# Patient Record
Sex: Female | Born: 1995 | Race: Black or African American | Hispanic: No | Marital: Single | Smoking: Current every day smoker
Health system: Southern US, Community
[De-identification: ages and names within clinical notes are randomized; demographics above are authoritative.]

## PROBLEM LIST (undated history)

## (undated) DIAGNOSIS — J45909 Unspecified asthma, uncomplicated: Secondary | ICD-10-CM

---

## 2013-12-11 ENCOUNTER — Emergency Department (HOSPITAL_COMMUNITY): Admission: EM | Admit: 2013-12-11 | Discharge: 2013-12-11 | Discharge status: Against Medical Advice | Payer: Self-pay

## 2014-06-14 ENCOUNTER — Encounter (HOSPITAL_COMMUNITY): Payer: Self-pay | Admitting: Emergency Medicine

## 2014-06-14 ENCOUNTER — Emergency Department (HOSPITAL_COMMUNITY)
Admission: EM | Admit: 2014-06-14 | Discharge: 2014-06-14 | Disposition: A | Discharge status: Home / Self Care | Payer: Self-pay | Attending: Emergency Medicine | Admitting: Emergency Medicine

## 2014-06-14 DIAGNOSIS — S01512A Laceration without foreign body of oral cavity, initial encounter: Secondary | ICD-10-CM

## 2014-06-14 DIAGNOSIS — J45909 Unspecified asthma, uncomplicated: Secondary | ICD-10-CM | POA: Insufficient documentation

## 2014-06-14 DIAGNOSIS — Y92410 Unspecified street and highway as the place of occurrence of the external cause: Secondary | ICD-10-CM | POA: Insufficient documentation

## 2014-06-14 DIAGNOSIS — Y9389 Activity, other specified: Secondary | ICD-10-CM | POA: Insufficient documentation

## 2014-06-14 DIAGNOSIS — W503XXA Accidental bite by another person, initial encounter: Secondary | ICD-10-CM | POA: Insufficient documentation

## 2014-06-14 DIAGNOSIS — S01501A Unspecified open wound of lip, initial encounter: Secondary | ICD-10-CM | POA: Insufficient documentation

## 2014-06-14 DIAGNOSIS — S01511A Laceration without foreign body of lip, initial encounter: Secondary | ICD-10-CM

## 2014-06-14 DIAGNOSIS — S01502A Unspecified open wound of oral cavity, initial encounter: Secondary | ICD-10-CM | POA: Insufficient documentation

## 2014-06-14 DIAGNOSIS — S301XXA Contusion of abdominal wall, initial encounter: Secondary | ICD-10-CM | POA: Insufficient documentation

## 2014-06-14 HISTORY — DX: Unspecified asthma, uncomplicated: J45.909
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
## 2014-06-15 NOTE — ED Provider Notes (Signed)
Medical screening examination/treatment/procedure(s) were performed by non-physician practitioner and as supervising physician I was immediately available for consultation/collaboration.    Aurielle Slingerland, MD 06/15/14 0010 

## 2016-03-21 ENCOUNTER — Encounter (HOSPITAL_COMMUNITY): Payer: Self-pay | Admitting: Emergency Medicine

## 2016-03-21 DIAGNOSIS — J45909 Unspecified asthma, uncomplicated: Secondary | ICD-10-CM | POA: Insufficient documentation

## 2016-03-21 DIAGNOSIS — F17200 Nicotine dependence, unspecified, uncomplicated: Secondary | ICD-10-CM | POA: Insufficient documentation

## 2016-03-21 DIAGNOSIS — N938 Other specified abnormal uterine and vaginal bleeding: Secondary | ICD-10-CM | POA: Insufficient documentation

## 2016-03-21 DIAGNOSIS — Z3202 Encounter for pregnancy test, result negative: Secondary | ICD-10-CM | POA: Insufficient documentation

## 2016-03-21 LAB — CBC
HCT: 40 % (ref 36.0–46.0)
HEMOGLOBIN: 13.2 g/dL (ref 12.0–15.0)
MCH: 29.2 pg (ref 26.0–34.0)
MCHC: 33 g/dL (ref 30.0–36.0)
MCV: 88.5 fL (ref 78.0–100.0)
PLATELETS: 266 10*3/uL (ref 150–400)
RBC: 4.52 MIL/uL (ref 3.87–5.11)
RDW: 13.3 % (ref 11.5–15.5)
WBC: 7.9 10*3/uL (ref 4.0–10.5)

## 2016-03-21 LAB — COMPREHENSIVE METABOLIC PANEL
ALT: 15 U/L (ref 14–54)
ANION GAP: 11 (ref 5–15)
AST: 19 U/L (ref 15–41)
Albumin: 3.6 g/dL (ref 3.5–5.0)
Alkaline Phosphatase: 98 U/L (ref 38–126)
BUN: 8 mg/dL (ref 6–20)
CALCIUM: 9.1 mg/dL (ref 8.9–10.3)
CO2: 22 mmol/L (ref 22–32)
Chloride: 107 mmol/L (ref 101–111)
Creatinine, Ser: 0.75 mg/dL (ref 0.44–1.00)
GFR calc Af Amer: >60 mL/min (ref >60)
GFR calc non Af Amer: >60 mL/min (ref >60)
GLUCOSE: 88 mg/dL (ref 65–99)
Potassium: 3.8 mmol/L (ref 3.5–5.1)
SODIUM: 140 mmol/L (ref 135–145)
TOTAL PROTEIN: 7.6 g/dL (ref 6.5–8.1)
Total Bilirubin: 1.2 mg/dL (ref 0.3–1.2)

## 2016-03-21 LAB — URINE MICROSCOPIC-ADD ON

## 2016-03-21 LAB — URINALYSIS, ROUTINE W REFLEX MICROSCOPIC
Glucose, UA: NEGATIVE mg/dL
Ketones, ur: 40 mg/dL — AB
NITRITE: NEGATIVE
Protein, ur: 30 mg/dL — AB
SPECIFIC GRAVITY, URINE: 1.029 (ref 1.005–1.030)
pH: 5.5 (ref 5.0–8.0)

## 2016-03-21 LAB — POC URINE PREG, ED: PREG TEST UR: NEGATIVE

## 2016-03-21 LAB — SAMPLE TO BLOOD BANK

## 2016-03-21 LAB — LIPASE, BLOOD: Lipase: 32 U/L (ref 11–51)

## 2016-03-21 NOTE — ED Notes (Signed)
Pt called several times to bring back to triage and unable to find her.

## 2016-03-21 NOTE — ED Notes (Signed)
Pt. reports vaginal bleeding for 4 weeks with generalized abdominal pain , denies  emesis or diarrhea , no fever or chills.

## 2016-03-22 ENCOUNTER — Emergency Department (HOSPITAL_COMMUNITY)
Admission: EM | Admit: 2016-03-22 | Discharge: 2016-03-22 | Disposition: A | Discharge status: Home / Self Care | Payer: No Typology Code available for payment source | Attending: Emergency Medicine | Admitting: Emergency Medicine

## 2016-03-22 DIAGNOSIS — N938 Other specified abnormal uterine and vaginal bleeding: Secondary | ICD-10-CM

## 2016-03-22 LAB — WET PREP, GENITAL
Clue Cells Wet Prep HPF POC: NONE SEEN
SPERM: NONE SEEN
Trich, Wet Prep: NONE SEEN
WBC, Wet Prep HPF POC: NONE SEEN
YEAST WET PREP: NONE SEEN

## 2016-03-22 LAB — GC/CHLAMYDIA PROBE AMP (~~LOC~~) NOT AT ARMC
Chlamydia: NEGATIVE
Neisseria Gonorrhea: NEGATIVE

## 2016-03-22 MED ORDER — MEDROXYPROGESTERONE ACETATE 5 MG PO TABS: 5.0000 mg | ORAL_TABLET | Freq: Every day | ORAL | Status: DC

## 2016-03-22 NOTE — Discharge Instructions (Signed)
Dysfunctional Uterine Bleeding Ms. Levit, take medication as prescribed to help with your bleeding. See a GYN doctor within 3 days for close follow up. If any symptoms worsen, come back to the ED immediately. Thank you. Dysfunctional uterine bleeding is abnormal bleeding from the uterus. Dysfunctional uterine bleeding includes:  A period that comes earlier or later than usual.  A period that is lighter, heavier, or has blood clots.  Bleeding between periods.  Skipping one or more periods.  Bleeding after sexual intercourse.  Bleeding after menopause. HOME CARE INSTRUCTIONS  Pay attention to any changes in your symptoms. Follow these instructions to help with your condition: Eating  Eat well-balanced meals. Include foods that are high in iron, such as liver, meat, shellfish, green leafy vegetables, and eggs.  If you become constipated:  Drink plenty of water.  Eat fruits and vegetables that are high in water and fiber, such as spinach, carrots, raspberries, apples, and mango. Medicines  Take over-the-counter and prescription medicines only as told by your health care provider.  Do not change medicines without talking with your health care provider.  Aspirin or medicines that contain aspirin may make the bleeding worse. Do not take those medicines:  During the week before your period.  During your period.  If you were prescribed iron pills, take them as told by your health care provider. Iron pills help to replace iron that your body loses because of this condition. Activity  If you need to change your sanitary pad or tampon more than one time every 2 hours:  Lie in bed with your feet raised (elevated).  Place a cold pack on your lower abdomen.  Rest as much as possible until the bleeding stops or slows down.  Do not try to lose weight until the bleeding has stopped and your blood iron level is back to normal. Other Instructions  For two months, write down:  When  your period starts.  When your period ends.  When any abnormal bleeding occurs.  What problems you notice.  Keep all follow up visits as told by your health care provider. This is important. SEEK MEDICAL CARE IF:  You get light-headed or weak.  You have nausea and vomiting.  You cannot eat or drink without vomiting.  You feel dizzy or have diarrhea while you are taking medicines.  You are taking birth control pills or hormones, and you want to change them or stop taking them. SEEK IMMEDIATE MEDICAL CARE IF:  You develop a fever or chills.  You need to change your sanitary pad or tampon more than one time per hour.  Your bleeding becomes heavier, or your flow contains clots more often.  You develop pain in your abdomen.  You lose consciousness.  You develop a rash.   This information is not intended to replace advice given to you by your health care provider. Make sure you discuss any questions you have with your health care provider.   Document Released: 11/22/2000 Document Revised: 08/16/2015 Document Reviewed: 02/20/2015 Elsevier Interactive Patient Education [employer]

## 2016-03-22 NOTE — ED Provider Notes (Signed)
CSN: [id number]     Arrival date & time 03/21/16  2056 History  By signing my name below, I, Marie Marshall, attest that this documentation has been prepared under the direction and in the presence of Tomasita CrumbleAdeleke Taksh Hjort, MD. Electronically Signed: Octavia HeirArianna Marshall, ED Scribe. 03/22/2016. 3:24 AM.    Chief Complaint  Patient presents with  . Vaginal Bleeding      The history is provided by the patient. No language interpreter was used.   HPI Comments: Marie CablesKaren Marshall is a 20 y.o. female who presents to the Emergency Department complaining of constant, gradual worsening, moderate, heavy vaginal bleeding onset for one month. She states she has been having lower abdominal pain and nausea. Pt says her period has lasted for about one month, which is longer than normal. She notes that she is able to wear a panty liner. Denies hx of ovarian cysts, vomiting, vaginal discharge, or dysuria.  Past Medical History  Diagnosis Date  . Asthma    History reviewed. No pertinent past surgical history. No family history on file. Social History  Substance Use Topics  . Smoking status: Current Every Day Smoker  . Smokeless tobacco: None  . Alcohol Use: No   OB History    No data available     Review of Systems  A complete 10 system review of systems was obtained and all systems are negative except as noted in the HPI and PMH.    Allergies  Review of patient's allergies indicates no known allergies.  Home Medications   Prior to Admission medications   Medication Sig Start Date End Date Taking? Authorizing Provider  etonogestrel (NEXPLANON) 68 MG IMPL implant Inject 1 each into the skin once.    Historical Provider, MD   Triage vitals: BP 113/55 mmHg  Pulse 56  Temp(Src) 98.1 F (36.7 C) (Oral)  Resp 16  SpO2 100%  LMP 03/03/2016 Physical Exam  Constitutional: She is oriented to person, place, and time. She appears well-developed and well-nourished. No distress.  HENT:  Head: Normocephalic and  atraumatic.  Nose: Nose normal.  Mouth/Throat: Oropharynx is clear and moist. No oropharyngeal exudate.  Eyes: Conjunctivae and EOM are normal. Pupils are equal, round, and reactive to light. No scleral icterus.  Neck: Normal range of motion. Neck supple. No JVD present. No tracheal deviation present. No thyromegaly present.  Cardiovascular: Normal rate, regular rhythm and normal heart sounds.  Exam reveals no gallop and no friction rub.   No murmur heard. Pulmonary/Chest: Effort normal and breath sounds normal. No respiratory distress. She has no wheezes. She exhibits no tenderness.  Abdominal: Soft. Bowel sounds are normal. She exhibits no distension and no mass. There is no tenderness. There is no rebound and no guarding.  Genitourinary:  Mild blood in vaginal vault, no active blood seen coming from cervix, no CMT, no adenexal tenderness  Musculoskeletal: Normal range of motion. She exhibits no edema or tenderness.  Lymphadenopathy:    She has no cervical adenopathy.  Neurological: She is alert and oriented to person, place, and time. No cranial nerve deficit. She exhibits normal muscle tone.  Skin: Skin is warm and dry. No rash noted. No erythema. No pallor.  Nursing note and vitals reviewed.   ED Course  Procedures  DIAGNOSTIC STUDIES: Oxygen Saturation is 100% on RA, normal by my interpretation.  COORDINATION OF CARE:  3:21 AM Discussed treatment plan which includes pelvic exam with pt at bedside and pt agreed to plan.  Labs Review Labs Reviewed  URINALYSIS, ROUTINE W REFLEX MICROSCOPIC (NOT AT [facility]) - Abnormal; Notable for the following:    Color, Urine AMBER (*)    Hgb urine dipstick LARGE (*)    Bilirubin Urine SMALL (*)    Ketones, ur 40 (*)    Protein, ur 30 (*)    Leukocytes, UA TRACE (*)    All other components within normal limits  URINE MICROSCOPIC-ADD ON - Abnormal; Notable for the following:    Squamous Epithelial / LPF 6-30 (*)    Bacteria, UA FEW (*)    All  other components within normal limits  WET PREP, GENITAL  LIPASE, BLOOD  COMPREHENSIVE METABOLIC PANEL  CBC  POC URINE PREG, ED  SAMPLE TO BLOOD BANK  GC/CHLAMYDIA PROBE AMP ([place]) NOT AT [facility]    Imaging Review No results found. I have personally reviewed and evaluated these images and lab results as part of my medical decision-making.   EKG Interpretation None      MDM   Final diagnoses:  None   Patient presents to the ED for vaginal bleeding x 1 month.  Physical exam does show some blood but not an impressive amount.  Hgb here is normal.  Wet prep also normal.  Will give progesterone to help stop the bleeding.  GYN fu provided as well. She appears well and in NAD.  VS remain within her normal limits and she is safe for DC.    I personally performed the services described in this documentation, which was scribed in my presence. The recorded information has been reviewed and is accurate.     Tomasita Crumble, MD 03/22/16 [phone]

## 2016-05-20 ENCOUNTER — Emergency Department (HOSPITAL_COMMUNITY)
Admission: EM | Admit: 2016-05-20 | Discharge: 2016-05-20 | Disposition: A | Discharge status: Home / Self Care | Payer: No Typology Code available for payment source | Attending: Emergency Medicine | Admitting: Emergency Medicine

## 2016-05-20 ENCOUNTER — Encounter (HOSPITAL_COMMUNITY): Payer: Self-pay | Admitting: Emergency Medicine

## 2016-05-20 DIAGNOSIS — J45909 Unspecified asthma, uncomplicated: Secondary | ICD-10-CM | POA: Insufficient documentation

## 2016-05-20 DIAGNOSIS — R300 Dysuria: Secondary | ICD-10-CM

## 2016-05-20 DIAGNOSIS — N390 Urinary tract infection, site not specified: Secondary | ICD-10-CM | POA: Insufficient documentation

## 2016-05-20 DIAGNOSIS — F17200 Nicotine dependence, unspecified, uncomplicated: Secondary | ICD-10-CM | POA: Insufficient documentation

## 2016-05-20 LAB — URINALYSIS, ROUTINE W REFLEX MICROSCOPIC
Bilirubin Urine: NEGATIVE
Glucose, UA: NEGATIVE mg/dL
Hgb urine dipstick: NEGATIVE
Ketones, ur: NEGATIVE mg/dL
Nitrite: NEGATIVE
Protein, ur: NEGATIVE mg/dL
SPECIFIC GRAVITY, URINE: 1.02 (ref 1.005–1.030)
pH: 6 (ref 5.0–8.0)

## 2016-05-20 LAB — BASIC METABOLIC PANEL
ANION GAP: 6 (ref 5–15)
BUN: 9 mg/dL (ref 6–20)
CALCIUM: 8.7 mg/dL — AB (ref 8.9–10.3)
CHLORIDE: 110 mmol/L (ref 101–111)
CO2: 22 mmol/L (ref 22–32)
Creatinine, Ser: 0.73 mg/dL (ref 0.44–1.00)
GFR calc Af Amer: >60 mL/min (ref >60)
GFR calc non Af Amer: >60 mL/min (ref >60)
GLUCOSE: 95 mg/dL (ref 65–99)
Potassium: 3.7 mmol/L (ref 3.5–5.1)
Sodium: 138 mmol/L (ref 135–145)

## 2016-05-20 LAB — CBC WITH DIFFERENTIAL/PLATELET
BASOS ABS: 0 10*3/uL (ref 0.0–0.1)
Basophils Relative: 0 %
Eosinophils Absolute: 0.4 10*3/uL (ref 0.0–0.7)
Eosinophils Relative: 4 %
HEMATOCRIT: 38.6 % (ref 36.0–46.0)
Hemoglobin: 12.1 g/dL (ref 12.0–15.0)
LYMPHS PCT: 41 %
Lymphs Abs: 3.8 10*3/uL (ref 0.7–4.0)
MCH: 28 pg (ref 26.0–34.0)
MCHC: 31.3 g/dL (ref 30.0–36.0)
MCV: 89.4 fL (ref 78.0–100.0)
MONOS PCT: 7 %
Monocytes Absolute: 0.7 10*3/uL (ref 0.1–1.0)
NEUTROS ABS: 4.5 10*3/uL (ref 1.7–7.7)
Neutrophils Relative %: 48 %
Platelets: 255 10*3/uL (ref 150–400)
RBC: 4.32 MIL/uL (ref 3.87–5.11)
RDW: 13.8 % (ref 11.5–15.5)
WBC: 9.4 10*3/uL (ref 4.0–10.5)

## 2016-05-20 LAB — URINE MICROSCOPIC-ADD ON

## 2016-05-20 LAB — POC URINE PREG, ED: Preg Test, Ur: NEGATIVE

## 2016-05-20 MED ORDER — NITROFURANTOIN MONOHYD MACRO 100 MG PO CAPS: 100.0000 mg | ORAL_CAPSULE | Freq: Two times a day (BID) | ORAL | Status: DC

## 2016-05-20 NOTE — Discharge Instructions (Signed)
1. Medications: Macrobid, usual home medications 2. Treatment: rest, drink plenty of fluids, take medications as prescribed 3. Follow Up: Please followup with your primary doctor in 3 days for discussion of your diagnoses and further evaluation after today's visit; if you do not have a primary care doctor use the resource guide provided to find one; return to the ER for fevers, persistent vomiting, worsening abdominal pain or other concerning symptoms.  

## 2016-05-20 NOTE — ED Provider Notes (Signed)
CSN: [id number]     Arrival date & time 05/20/16  0027 History   First MD Initiated Contact with Patient 05/20/16 0355     Chief Complaint  Patient presents with  . Dysuria  . Urinary Frequency     (Consider location/radiation/quality/duration/timing/severity/associated sxs/prior Treatment) The history is provided by the patient and medical records. No language interpreter was used.     Marie CablesKaren Mccluney is a 20 y.o. female  with a hx of asthma presents to the Emergency Department complaining of intermittent dysuria and urinary frequency onset 4 weeks ago.  Associated symptoms include lower abd cramping.  Pt reports the pain most commonly comes at night while at work.  She reports that it does not occur when she is not at work.   Pt reports at work she is holding her urine for long periods and sometimes this causes the pain.  Pt reports this does not happen every night, but cannot tell me how many nights per week it does occur.  She reports ssx last several hours and then resolve spontaneously.  She is pain free at this time.  Pt reports she was drinking lots of soda and decreasing this has helped. Pt denies fever, chills, headache, neck pain, chest pain, SOB, vomiting, diarrhea, weakness, dizziness, syncope, vaginal discharge, vaginal lesions.  Pt denies currently being sexually active.  LMP: Last week..    Past Medical History  Diagnosis Date  . Asthma    History reviewed. No pertinent past surgical history. No family history on file. Social History  Substance Use Topics  . Smoking status: Current Every Day Smoker  . Smokeless tobacco: None  . Alcohol Use: No   OB History    No data available     Review of Systems  Constitutional: Negative for fever, diaphoresis, appetite change, fatigue and unexpected weight change.  HENT: Negative for mouth sores.   Eyes: Negative for visual disturbance.  Respiratory: Negative for cough, chest tightness, shortness of breath and wheezing.    Cardiovascular: Negative for chest pain.  Gastrointestinal: Positive for abdominal pain ( cramping). Negative for nausea, vomiting, diarrhea and constipation.  Endocrine: Negative for polydipsia, polyphagia and polyuria.  Genitourinary: Positive for dysuria and frequency. Negative for urgency and hematuria.  Musculoskeletal: Negative for back pain and neck stiffness.  Skin: Negative for rash.  Allergic/Immunologic: Negative for immunocompromised state.  Neurological: Negative for syncope, light-headedness and headaches.  Hematological: Does not bruise/bleed easily.  Psychiatric/Behavioral: Negative for sleep disturbance. The patient is not nervous/anxious.       Allergies  Review of patient's allergies indicates no known allergies.  Home Medications   Prior to Admission medications   Medication Sig Start Date End Date Taking? Authorizing Provider  albuterol (PROVENTIL HFA;VENTOLIN HFA) 108 (90 Base) MCG/ACT inhaler Inhale 1-2 puffs into the lungs every 6 (six) hours as needed for wheezing or shortness of breath.   Yes Historical Provider, MD  CRANBERRY PO Take 1 tablet by mouth 2 (two) times daily.   Yes Historical Provider, MD  etonogestrel (NEXPLANON) 68 MG IMPL implant Inject 1 each into the skin once.   Yes Historical Provider, MD  Fluticasone-Salmeterol (ADVAIR) 100-50 MCG/DOSE AEPB Inhale 1 puff into the lungs 2 (two) times daily as needed (shortness of breath).   Yes Historical Provider, MD  nitrofurantoin, macrocrystal-monohydrate, (MACROBID) 100 MG capsule Take 1 capsule (100 mg total) by mouth 2 (two) times daily. X 7 days 05/20/16   Dahlia ClientHannah Bea Duren, PA-C   BP 128/61 mmHg  Pulse 99  Temp(Src) 98.1 F (36.7 C) (Oral)  Resp 16  Ht  (1.499 m)  Wt 76.459 kg  BMI 34.03 kg/m2  SpO2 100%  LMP 05/13/2016 (Approximate) Physical Exam  Constitutional: She appears well-developed and well-nourished. No distress.  Awake, alert, nontoxic appearance  HENT:  Head:  Normocephalic and atraumatic.  Mouth/Throat: Oropharynx is clear and moist. No oropharyngeal exudate.  Eyes: Conjunctivae are normal. No scleral icterus.  Neck: Normal range of motion. Neck supple.  Cardiovascular: Normal rate, regular rhythm and intact distal pulses.   Pulmonary/Chest: Effort normal and breath sounds normal. No respiratory distress. She has no wheezes.  Equal chest expansion  Abdominal: Soft. Bowel sounds are normal. She exhibits no distension and no mass. There is no tenderness. There is no rebound, no guarding and no CVA tenderness. Hernia confirmed negative in the right inguinal area and confirmed negative in the left inguinal area.  Genitourinary: No labial fusion. There is no rash, tenderness, lesion or injury on the right labia. There is no rash, tenderness, lesion or injury on the left labia.  External genitalia without lesions, fissures, abscesses, irritation, ulcers, vesiccles or rash.  No discharge noted at the introitus.    Musculoskeletal: Normal range of motion. She exhibits no edema.  Lymphadenopathy:       Right: No inguinal adenopathy present.       Left: No inguinal adenopathy present.  Neurological: She is alert.  Speech is clear and goal oriented Moves extremities without ataxia  Skin: Skin is warm and dry. She is not diaphoretic.  Psychiatric: She has a normal mood and affect.  Nursing note and vitals reviewed.   ED Course  Procedures (including critical care time) Labs Review Labs Reviewed  URINALYSIS, ROUTINE W REFLEX MICROSCOPIC (NOT AT [facility]) - Abnormal; Notable for the following:    Leukocytes, UA SMALL (*)    All other components within normal limits  BASIC METABOLIC PANEL - Abnormal; Notable for the following:    Calcium 8.7 (*)    All other components within normal limits  URINE MICROSCOPIC-ADD ON - Abnormal; Notable for the following:    Squamous Epithelial / LPF 6-30 (*)    Bacteria, UA RARE (*)    All other components within normal  limits  URINE CULTURE  CBC WITH DIFFERENTIAL/PLATELET  POC URINE PREG, ED     MDM   Final diagnoses:  Dysuria  UTI (lower urinary tract infection)   Marie Marshall presents with several weeks of intermittent dysuria and lower abd cramping.  Suspect this is from holding her urine for long periods of time at work.  Pt is without abd pain at this time and abd is soft and nontender. No fever, chills, flank pain, vomiting or CVA tenderness.  Doubt pyelonephritis.  UA with leukocytes and 6-30 WBCs.  UCx sent.  Normal creatinine.  No leukocytosis.  Pt d/c with macrobid.  Return precautions discussed.    Dahlia Client Rosene Pilling, PA-C 05/20/16 1610  Dione Booze, MD 05/20/16 [phone]

## 2016-05-20 NOTE — ED Notes (Signed)
Pt. reports dysuria /urinary frequency with mild low abdominal cramping onset 2 weeks ago , denies nausea and emesis . No fever or chills.

## 2016-05-21 LAB — URINE CULTURE

## 2017-03-04 ENCOUNTER — Encounter (HOSPITAL_COMMUNITY): Payer: Self-pay | Admitting: Emergency Medicine

## 2017-03-04 ENCOUNTER — Emergency Department (HOSPITAL_COMMUNITY)
Admission: EM | Admit: 2017-03-04 | Discharge: 2017-03-04 | Disposition: A | Discharge status: Home / Self Care | Payer: Self-pay | Attending: Emergency Medicine | Admitting: Emergency Medicine

## 2017-03-04 DIAGNOSIS — J45909 Unspecified asthma, uncomplicated: Secondary | ICD-10-CM | POA: Insufficient documentation

## 2017-03-04 DIAGNOSIS — A599 Trichomoniasis, unspecified: Secondary | ICD-10-CM | POA: Insufficient documentation

## 2017-03-04 DIAGNOSIS — Z79899 Other long term (current) drug therapy: Secondary | ICD-10-CM | POA: Insufficient documentation

## 2017-03-04 DIAGNOSIS — B3731 Acute candidiasis of vulva and vagina: Secondary | ICD-10-CM

## 2017-03-04 DIAGNOSIS — F17200 Nicotine dependence, unspecified, uncomplicated: Secondary | ICD-10-CM | POA: Insufficient documentation

## 2017-03-04 DIAGNOSIS — B373 Candidiasis of vulva and vagina: Secondary | ICD-10-CM | POA: Insufficient documentation

## 2017-03-04 LAB — URINALYSIS, ROUTINE W REFLEX MICROSCOPIC
BILIRUBIN URINE: NEGATIVE
Glucose, UA: NEGATIVE mg/dL
Hgb urine dipstick: NEGATIVE
KETONES UR: NEGATIVE mg/dL
Nitrite: NEGATIVE
PH: 7 (ref 5.0–8.0)
Protein, ur: NEGATIVE mg/dL
SPECIFIC GRAVITY, URINE: 1.012 (ref 1.005–1.030)

## 2017-03-04 LAB — WET PREP, GENITAL
CLUE CELLS WET PREP: NONE SEEN
Sperm: NONE SEEN
YEAST WET PREP: NONE SEEN

## 2017-03-04 LAB — COMPREHENSIVE METABOLIC PANEL
ALBUMIN: 3.6 g/dL (ref 3.5–5.0)
ALT: 16 U/L (ref 14–54)
AST: 20 U/L (ref 15–41)
Alkaline Phosphatase: 76 U/L (ref 38–126)
Anion gap: 7 (ref 5–15)
BUN: 6 mg/dL (ref 6–20)
CHLORIDE: 106 mmol/L (ref 101–111)
CO2: 22 mmol/L (ref 22–32)
CREATININE: 0.65 mg/dL (ref 0.44–1.00)
Calcium: 8.7 mg/dL — ABNORMAL LOW (ref 8.9–10.3)
GFR calc Af Amer: >60 mL/min (ref >60)
GLUCOSE: 104 mg/dL — AB (ref 65–99)
POTASSIUM: 3.3 mmol/L — AB (ref 3.5–5.1)
SODIUM: 135 mmol/L (ref 135–145)
Total Bilirubin: 1.3 mg/dL — ABNORMAL HIGH (ref 0.3–1.2)
Total Protein: 7.3 g/dL (ref 6.5–8.1)

## 2017-03-04 LAB — CBC
HEMATOCRIT: 40 % (ref 36.0–46.0)
Hemoglobin: 13.5 g/dL (ref 12.0–15.0)
MCH: 29.7 pg (ref 26.0–34.0)
MCHC: 33.8 g/dL (ref 30.0–36.0)
MCV: 88.1 fL (ref 78.0–100.0)
PLATELETS: 201 10*3/uL (ref 150–400)
RBC: 4.54 MIL/uL (ref 3.87–5.11)
RDW: 13.3 % (ref 11.5–15.5)
WBC: 6.3 10*3/uL (ref 4.0–10.5)

## 2017-03-04 LAB — I-STAT BETA HCG BLOOD, ED (MC, WL, AP ONLY)

## 2017-03-04 LAB — LIPASE, BLOOD: LIPASE: 26 U/L (ref 11–51)

## 2017-03-04 MED ORDER — FLUCONAZOLE 150 MG PO TABS: 150.0000 mg | ORAL_TABLET | Freq: Every day | ORAL | 0 refills | Status: AC

## 2017-03-04 MED ORDER — METRONIDAZOLE 500 MG PO TABS
2000.0000 mg | ORAL_TABLET | Freq: Once | ORAL | Status: AC
  Administered 2017-03-04: 2000 mg via ORAL
  Filled 2017-03-04: qty 4

## 2017-03-04 NOTE — ED Triage Notes (Signed)
Pt states that she has had N/V x 2 days with vaginal itching. Denies abdominal pain or vaginal discharge. Alert and oriented.

## 2017-03-04 NOTE — ED Provider Notes (Signed)
[facility]-EMERGENCY DEPT Provider Note   CSN: [id number] Arrival date & time: 03/04/17  1510     History   Chief Complaint Chief Complaint  Patient presents with  . Emesis  . Vaginal Itching    HPI Lamara Brecht is a 21 y.o. female.  Patient presents with complaint of nausea, vomiting and infrequent diarrhea x 3 days. She denies fever. She is able to eat and drink throughout the day. No pain. She states 2 of her girlfriends have had similar symptoms.   She also complains of vaginal itching x 2 weeks, worse in the last 3 days. No vaginal discharge, pain, irregular bleeding. She is on Nexplanon and denies chance of pregnancy.   The history is provided by the patient. No language interpreter was used.  Emesis   Associated symptoms include diarrhea. Pertinent negatives include no abdominal pain, no chills, no fever and no myalgias.  Vaginal Itching  Pertinent negatives include no abdominal pain.    Past Medical History:  Diagnosis Date  . Asthma     There are no active problems to display for this patient.   History reviewed. No pertinent surgical history.  OB History    No data available       Home Medications    Prior to Admission medications   Medication Sig Start Date End Date Taking? Authorizing Provider  albuterol (PROVENTIL HFA;VENTOLIN HFA) 108 (90 Base) MCG/ACT inhaler Inhale 1-2 puffs into the lungs every 6 (six) hours as needed for wheezing or shortness of breath.    Historical Provider, MD  CRANBERRY PO Take 1 tablet by mouth 2 (two) times daily.    Historical Provider, MD  etonogestrel (NEXPLANON) 68 MG IMPL implant Inject 1 each into the skin once.    Historical Provider, MD  Fluticasone-Salmeterol (ADVAIR) 100-50 MCG/DOSE AEPB Inhale 1 puff into the lungs 2 (two) times daily as needed (shortness of breath).    Historical Provider, MD  nitrofurantoin, macrocrystal-monohydrate, (MACROBID) 100 MG capsule Take 1 capsule (100 mg total) by mouth 2 (two) times  daily. X 7 days 05/20/16   Dierdre Forth, PA-C    Family History History reviewed. No pertinent family history.  Social History Social History  Substance Use Topics  . Smoking status: Current Every Day Smoker  . Smokeless tobacco: Not on file  . Alcohol use No     Allergies   Patient has no known allergies.   Review of Systems Review of Systems  Constitutional: Negative for chills and fever.  Gastrointestinal: Positive for diarrhea, nausea and vomiting. Negative for abdominal pain.  Genitourinary: Negative for dysuria, vaginal bleeding and vaginal discharge.       C/o vaginal itching  Musculoskeletal: Negative.  Negative for back pain and myalgias.  Neurological: Negative.      Physical Exam Updated Vital Signs BP 119/65 (BP Location: Left Arm)   Pulse 73   Temp 98.2 F (36.8 C) (Oral)   Resp 18   LMP  (Exact Date)   SpO2 99%   Physical Exam  Constitutional: She is oriented to person, place, and time. She appears well-developed and well-nourished.  Neck: Normal range of motion.  Pulmonary/Chest: Effort normal. No respiratory distress.  Abdominal: Soft. She exhibits no distension. There is no tenderness. There is no guarding.  Genitourinary:  Genitourinary Comments: Normal external vaginal exam. No vulvar swelling or redness. There is no cervical discharge visualized. No CMT, adnexal mass or tenderness. Tehre is w thick, white vaginal discharge in clumps.   Neurological: She  is alert and oriented to person, place, and time.  Skin: Skin is warm and dry.     ED Treatments / Results  Labs (all labs ordered are listed, but only abnormal results are displayed) Labs Reviewed  COMPREHENSIVE METABOLIC PANEL - Abnormal; Notable for the following:       Result Value   Potassium 3.3 (*)    Glucose, Bld 104 (*)    Calcium 8.7 (*)    Total Bilirubin 1.3 (*)    All other components within normal limits  LIPASE, BLOOD  CBC  URINALYSIS, ROUTINE W REFLEX  MICROSCOPIC  I-STAT BETA HCG BLOOD, ED ([facility], [facility], [facility] ONLY)   Results for orders placed or performed during the hospital encounter of 03/04/17  Wet prep, genital  Result Value Ref Range   Yeast Wet Prep HPF POC NONE SEEN NONE SEEN   Trich, Wet Prep PRESENT (A) NONE SEEN   Clue Cells Wet Prep HPF POC NONE SEEN NONE SEEN   WBC, Wet Prep HPF POC RARE (A) NONE SEEN   Sperm NONE SEEN   Lipase, blood  Result Value Ref Range   Lipase 26 11 - 51 U/L  Comprehensive metabolic panel  Result Value Ref Range   Sodium 135 135 - 145 mmol/L   Potassium 3.3 (L) 3.5 - 5.1 mmol/L   Chloride 106 101 - 111 mmol/L   CO2 22 22 - 32 mmol/L   Glucose, Bld 104 (H) 65 - 99 mg/dL   BUN 6 6 - 20 mg/dL   Creatinine, Ser 2.72 0.44 - 1.00 mg/dL   Calcium 8.7 (L) 8.9 - 10.3 mg/dL   Total Protein 7.3 6.5 - 8.1 g/dL   Albumin 3.6 3.5 - 5.0 g/dL   AST 20 15 - 41 U/L   ALT 16 14 - 54 U/L   Alkaline Phosphatase 76 38 - 126 U/L   Total Bilirubin 1.3 (H) 0.3 - 1.2 mg/dL   GFR calc non Af Amer >60 >60 mL/min   GFR calc Af Amer >60 >60 mL/min   Anion gap 7 5 - 15  CBC  Result Value Ref Range   WBC 6.3 4.0 - 10.5 K/uL   RBC 4.54 3.87 - 5.11 MIL/uL   Hemoglobin 13.5 12.0 - 15.0 g/dL   HCT 53.6 64.4 - 03.4 %   MCV 88.1 78.0 - 100.0 fL   MCH 29.7 26.0 - 34.0 pg   MCHC 33.8 30.0 - 36.0 g/dL   RDW 74.2 59.5 - 63.8 %   Platelets 201 150 - 400 K/uL  Urinalysis, Routine w reflex microscopic  Result Value Ref Range   Color, Urine YELLOW YELLOW   APPearance HAZY (A) CLEAR   Specific Gravity, Urine 1.012 1.005 - 1.030   pH 7.0 5.0 - 8.0   Glucose, UA NEGATIVE NEGATIVE mg/dL   Hgb urine dipstick NEGATIVE NEGATIVE   Bilirubin Urine NEGATIVE NEGATIVE   Ketones, ur NEGATIVE NEGATIVE mg/dL   Protein, ur NEGATIVE NEGATIVE mg/dL   Nitrite NEGATIVE NEGATIVE   Leukocytes, UA SMALL (A) NEGATIVE   RBC / HPF 0-5 0 - 5 RBC/hpf   WBC, UA 6-30 0 - 5 WBC/hpf   Bacteria, UA RARE (A) NONE SEEN   Squamous Epithelial / LPF 6-30 (A)  NONE SEEN   Mucous PRESENT   I-Stat beta hCG blood, ED  Result Value Ref Range   I-stat hCG, quantitative <5.0 <5 mIU/mL   Comment 3            EKG  EKG Interpretation None       Radiology No results found.  Procedures Procedures (including critical care time)  Medications Ordered in ED Medications - No data to display   Initial Impression / Assessment and Plan / ED Course  I have reviewed the triage vital signs and the nursing notes.  Pertinent labs & imaging results that were available during my care of the patient were reviewed by me and considered in my medical decision making (see chart for details).     Well appearing patient presents with complaint of N, V, D x 3 days, similar to multiple friends. No fever. Normal exam, benign abdomen. Likely viral. No vomiting in ED. She reports being able to eat and drink at home.   Vaginal discharge c/w yeast. Trichomonas discovered on wet prep. The patient is treated with Flagyl in the ED and Diflucan by prescription. Cultures pending, expected to be negative.   Final Clinical Impressions(s) / ED Diagnoses   Final diagnoses:  None   1. Mild gastroenteritis 2. Trichomonas vaginitis 3. Yeast vaginitis  New Prescriptions New Prescriptions   No medications on file     Elpidio AnisShari Cherish Runde, PA-C 03/06/17 0111    Arby BarretteMarcy Pfeiffer, MD 03/09/17 [phone]

## 2017-03-06 LAB — GC/CHLAMYDIA PROBE AMP (~~LOC~~) NOT AT ARMC
Chlamydia: NEGATIVE
NEISSERIA GONORRHEA: NEGATIVE

## 2017-05-07 ENCOUNTER — Emergency Department (HOSPITAL_COMMUNITY): Payer: Self-pay

## 2017-05-07 ENCOUNTER — Encounter (HOSPITAL_COMMUNITY): Payer: Self-pay

## 2017-05-07 ENCOUNTER — Emergency Department (HOSPITAL_COMMUNITY)
Admission: EM | Admit: 2017-05-07 | Discharge: 2017-05-08 | Disposition: A | Discharge status: Home / Self Care | Payer: Self-pay | Attending: Emergency Medicine | Admitting: Emergency Medicine

## 2017-05-07 DIAGNOSIS — F17210 Nicotine dependence, cigarettes, uncomplicated: Secondary | ICD-10-CM | POA: Insufficient documentation

## 2017-05-07 DIAGNOSIS — J4521 Mild intermittent asthma with (acute) exacerbation: Secondary | ICD-10-CM | POA: Insufficient documentation

## 2017-05-07 DIAGNOSIS — Z7951 Long term (current) use of inhaled steroids: Secondary | ICD-10-CM | POA: Insufficient documentation

## 2017-05-07 DIAGNOSIS — J069 Acute upper respiratory infection, unspecified: Secondary | ICD-10-CM | POA: Insufficient documentation

## 2017-05-07 LAB — RAPID STREP SCREEN (MED CTR MEBANE ONLY): Streptococcus, Group A Screen (Direct): NEGATIVE

## 2017-05-07 MED ORDER — AEROCHAMBER PLUS FLO-VU MEDIUM MISC
1.0000 | Freq: Once | Status: AC
  Administered 2017-05-08: 1

## 2017-05-07 MED ORDER — IPRATROPIUM BROMIDE 0.02 % IN SOLN
0.5000 mg | Freq: Once | RESPIRATORY_TRACT | Status: AC
  Administered 2017-05-07: 0.5 mg via RESPIRATORY_TRACT
  Filled 2017-05-07: qty 2.5

## 2017-05-07 MED ORDER — PREDNISONE 10 MG PO TABS: 40.0000 mg | ORAL_TABLET | Freq: Every day | ORAL | 0 refills | Status: AC

## 2017-05-07 MED ORDER — PREDNISONE 20 MG PO TABS
60.0000 mg | ORAL_TABLET | Freq: Once | ORAL | Status: AC
  Administered 2017-05-07: 60 mg via ORAL
  Filled 2017-05-07: qty 3

## 2017-05-07 MED ORDER — ALBUTEROL SULFATE (2.5 MG/3ML) 0.083% IN NEBU
5.0000 mg | INHALATION_SOLUTION | Freq: Once | RESPIRATORY_TRACT | Status: AC
  Administered 2017-05-07: 5 mg via RESPIRATORY_TRACT
  Filled 2017-05-07: qty 6

## 2017-05-07 NOTE — ED Triage Notes (Signed)
PT C/O SORE THROAT WITH A PRODUCTIVE COUGH X2 DAYS. DENIES FEVER.

## 2017-05-07 NOTE — ED Provider Notes (Signed)
[facility]-EMERGENCY DEPT Provider Note   CSN: [id number] Arrival date & time: 05/07/17  1908  By signing my name below, I, Ny'Kea Lewis, attest that this documentation has been prepared under the direction and in the presence of Mathews Robinsons, PA-C. Electronically Signed: Karren Cobble, ED Scribe. 05/07/17. 10:10 PM.   History   Chief Complaint Chief Complaint  Patient presents with  . Sore Throat  . Cough   The history is provided by the patient. No language interpreter was used.   HPI Comments: Marie Marshall is a 21 y.o. female with a PMHx of asthma, who presents to the Emergency Department complaining of sudden onset persistent cough that began yesterday. Pt notes associated shortness of breath.  She notes a hx of asthma and has tried her inhaler with no relief of her shortness of breath. Denies recent sick contact. No hx of ED visit for asthma exacerbation requiring prednisone, intubation, or admission. Denies fever, chills, sore throat, nausea, or vomiting at this time.   Past Medical History:  Diagnosis Date  . Asthma    There are no active problems to display for this patient.   History reviewed. No pertinent surgical history.  OB History    No data available       Home Medications    Prior to Admission medications   Medication Sig Start Date End Date Taking? Authorizing Provider  albuterol (PROVENTIL HFA;VENTOLIN HFA) 108 (90 Base) MCG/ACT inhaler Inhale 1-2 puffs into the lungs every 6 (six) hours as needed for wheezing or shortness of breath.    [provider]  CRANBERRY PO Take 1 tablet by mouth 2 (two) times daily.    [provider]  etonogestrel (NEXPLANON) 68 MG IMPL implant Inject 1 each into the skin once.    [provider]  predniSONE (DELTASONE) 10 MG tablet Take 4 tablets (40 mg total) by mouth daily. 05/07/17 05/11/17  Georgiana Shore, PA-C    Family History History reviewed. No pertinent family history.  Social  History Social History  Substance Use Topics  . Smoking status: Current Every Day Smoker    Packs/day: 0.50    Types: Cigars  . Smokeless tobacco: Never Used  . Alcohol use No     Allergies   Patient has no known allergies.   Review of Systems Review of Systems  Constitutional: Negative for chills and fever.  HENT: Positive for congestion. Negative for sinus pain, sinus pressure, sore throat and trouble swallowing.   Respiratory: Positive for cough and wheezing. Negative for stridor.   Cardiovascular: Negative for chest pain.  Gastrointestinal: Negative for nausea and vomiting.  Skin: Negative for color change, pallor and rash.  Neurological: Negative for dizziness, weakness and light-headedness.     Physical Exam Updated Vital Signs BP 120/63 (BP Location: Right Arm)   Pulse 88   Temp 98.6 F (37 C) (Oral)   Resp 15   Ht 4\' 11"  (1.499 m)   Wt 76.7 kg (169 lb)   LMP 05/02/2017   SpO2 99%   BMI 34.13 kg/m   Physical Exam  Constitutional: She is oriented to person, place, and time. She appears well-developed and well-nourished. No distress.  Patient is afebrile, non-toxic appearing, seating comfortably in chair in no acute distress.   HENT:  Head: Normocephalic and atraumatic.  Mouth/Throat: Oropharynx is clear and moist. No oropharyngeal exudate.  Left cervical adenopathy.   Eyes: EOM are normal. Right eye exhibits no discharge. Left eye exhibits no discharge.  Cardiovascular: Normal  rate, regular rhythm and normal heart sounds.   Pulmonary/Chest: Effort normal. No respiratory distress. She has wheezes. She has no rales. She exhibits no tenderness.  Diffuse wheezing on expiration bilaterally.   Neurological: She is alert and oriented to person, place, and time.  Skin: Skin is warm and dry. No rash noted. She is not diaphoretic. No erythema. No pallor.  Psychiatric: She has a normal mood and affect.  Nursing note and vitals reviewed.  ED Treatments / Results   DIAGNOSTIC STUDIES: Oxygen Saturation is 98% on RA, normal by my interpretation.   COORDINATION OF CARE: 10:00 PM-Discussed next steps with pt. Pt verbalized understanding and is agreeable with the plan.   Labs (all labs ordered are listed, but only abnormal results are displayed) Labs Reviewed  RAPID STREP SCREEN (NOT AT [facility])  CULTURE, GROUP A STREP [facility]    EKG  EKG Interpretation None       Radiology Dg Chest 2 View  Result Date: 05/07/2017 CLINICAL DATA:  21 y/o  F; COUGH EXAM: CHEST  2 VIEW COMPARISON:  None. FINDINGS: The heart size and mediastinal contours are within normal limits. Both lungs are clear. The visualized skeletal structures are unremarkable. IMPRESSION: No active cardiopulmonary disease. Electronically Signed   By: Mitzi HansenLance  Furusawa-Stratton M.D.   On: 05/07/2017 20:36    Procedures Procedures (including critical care time)  Medications Ordered in ED Medications  AEROCHAMBER PLUS FLO-VU MEDIUM MISC 1 each (not administered)  predniSONE (DELTASONE) tablet 60 mg (60 mg Oral Given 05/07/17 2212)  albuterol (PROVENTIL) (2.5 MG/3ML) 0.083% nebulizer solution 5 mg (5 mg Nebulization Given 05/07/17 2213)  ipratropium (ATROVENT) nebulizer solution 0.5 mg (0.5 mg Nebulization Given 05/07/17 2213)    Initial Impression / Assessment and Plan / ED Course  I have reviewed the triage vital signs and the nursing notes.  Pertinent labs & imaging results that were available during my care of the patient were reviewed by me and considered in my medical decision making (see chart for details).    Patient presents with upper respiratory infection with asthma exacerbation. Wheezing on exam otherwise exam unremarkable. She was given nebulizing treatment while in the ED and significantly improved. On reassessment she was no longer wheezing.  Patient was educated on the use of a spacer with her inhaler and provided with one here. Negative chest x-ray and negative strep.  She reported no sore throat at this time. Will discharge home with prednisone burst and close follow-up with PCP. She was afebrile nontoxic and stable.  Discussed strict return precautions and advised to return to the emergency department if experiencing any new or worsening symptoms. Instructions were understood and patient agreed with discharge plan.  Final Clinical Impressions(s) / ED Diagnoses   Final diagnoses:  Viral upper respiratory tract infection  Mild intermittent asthma with exacerbation    New Prescriptions New Prescriptions   PREDNISONE (DELTASONE) 10 MG TABLET    Take 4 tablets (40 mg total) by mouth daily.  I personally performed the services described in this documentation, which was scribed in my presence. The recorded information has been reviewed and is accurate.     Georgiana ShoreMitchell, Angelyse Heslin B, PA-C 05/07/17 2349    Raeford RazorKohut, Stephen, MD 05/11/17 2014

## 2017-05-07 NOTE — Discharge Instructions (Signed)
As discussed, please stay well-hydrated keep your urine clear. Use your new spacer as demonstrated. He will be taking prednisone for the next 4 days starting tomorrow. Follow-up with her primary care provider. Return to ER if symptoms worsen, or short of breath, chest pain or any other new concerning symptoms in the meantime

## 2017-05-10 LAB — CULTURE, GROUP A STREP (THRC)

## 2019-04-15 IMAGING — CR DG CHEST 2V
2 series · 2 of 2 positions shown · non-contrast
Comparison: None.

CLINICAL DATA: 20 y/o  F; COUGH

EXAM:
CHEST  2 VIEW

[w chest pa]
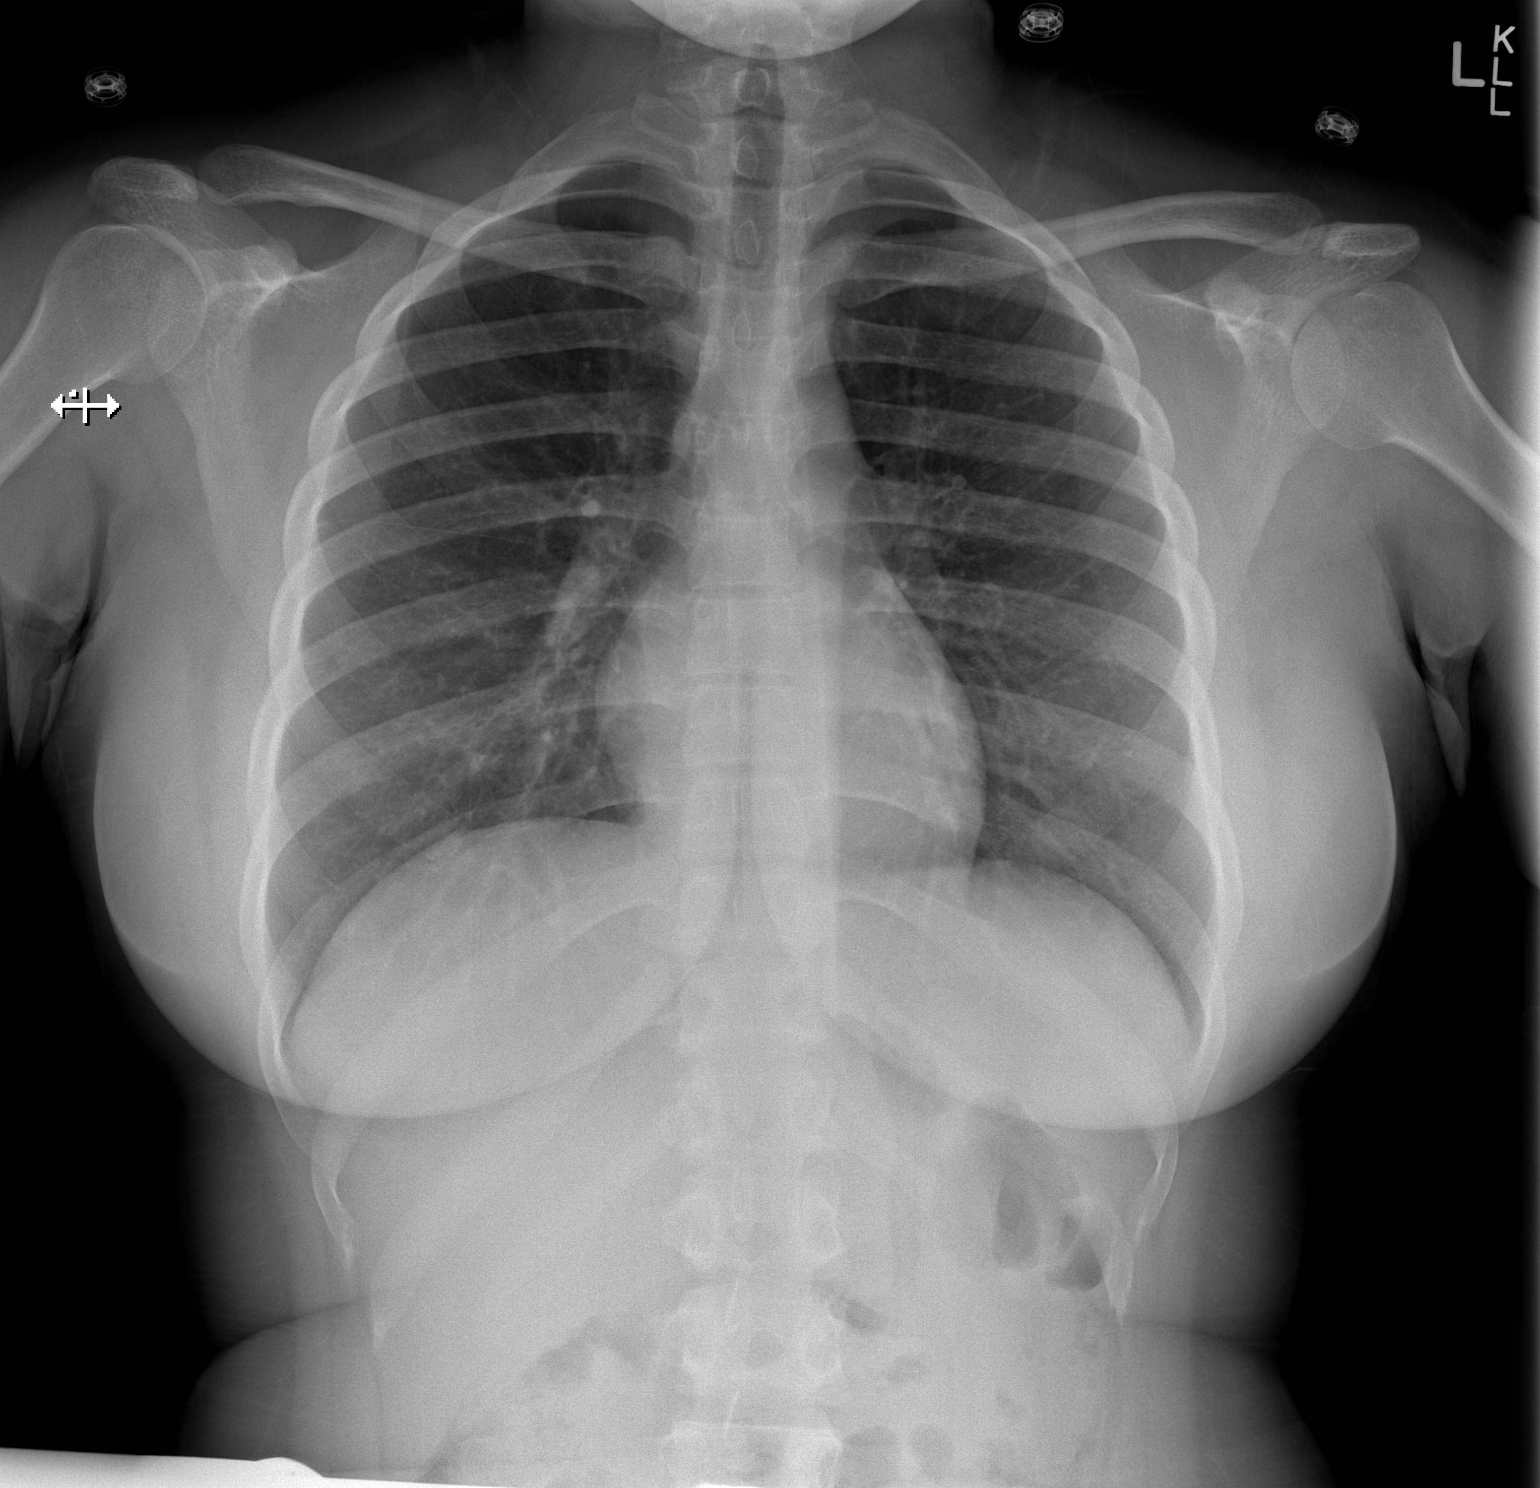

[w chest lat]
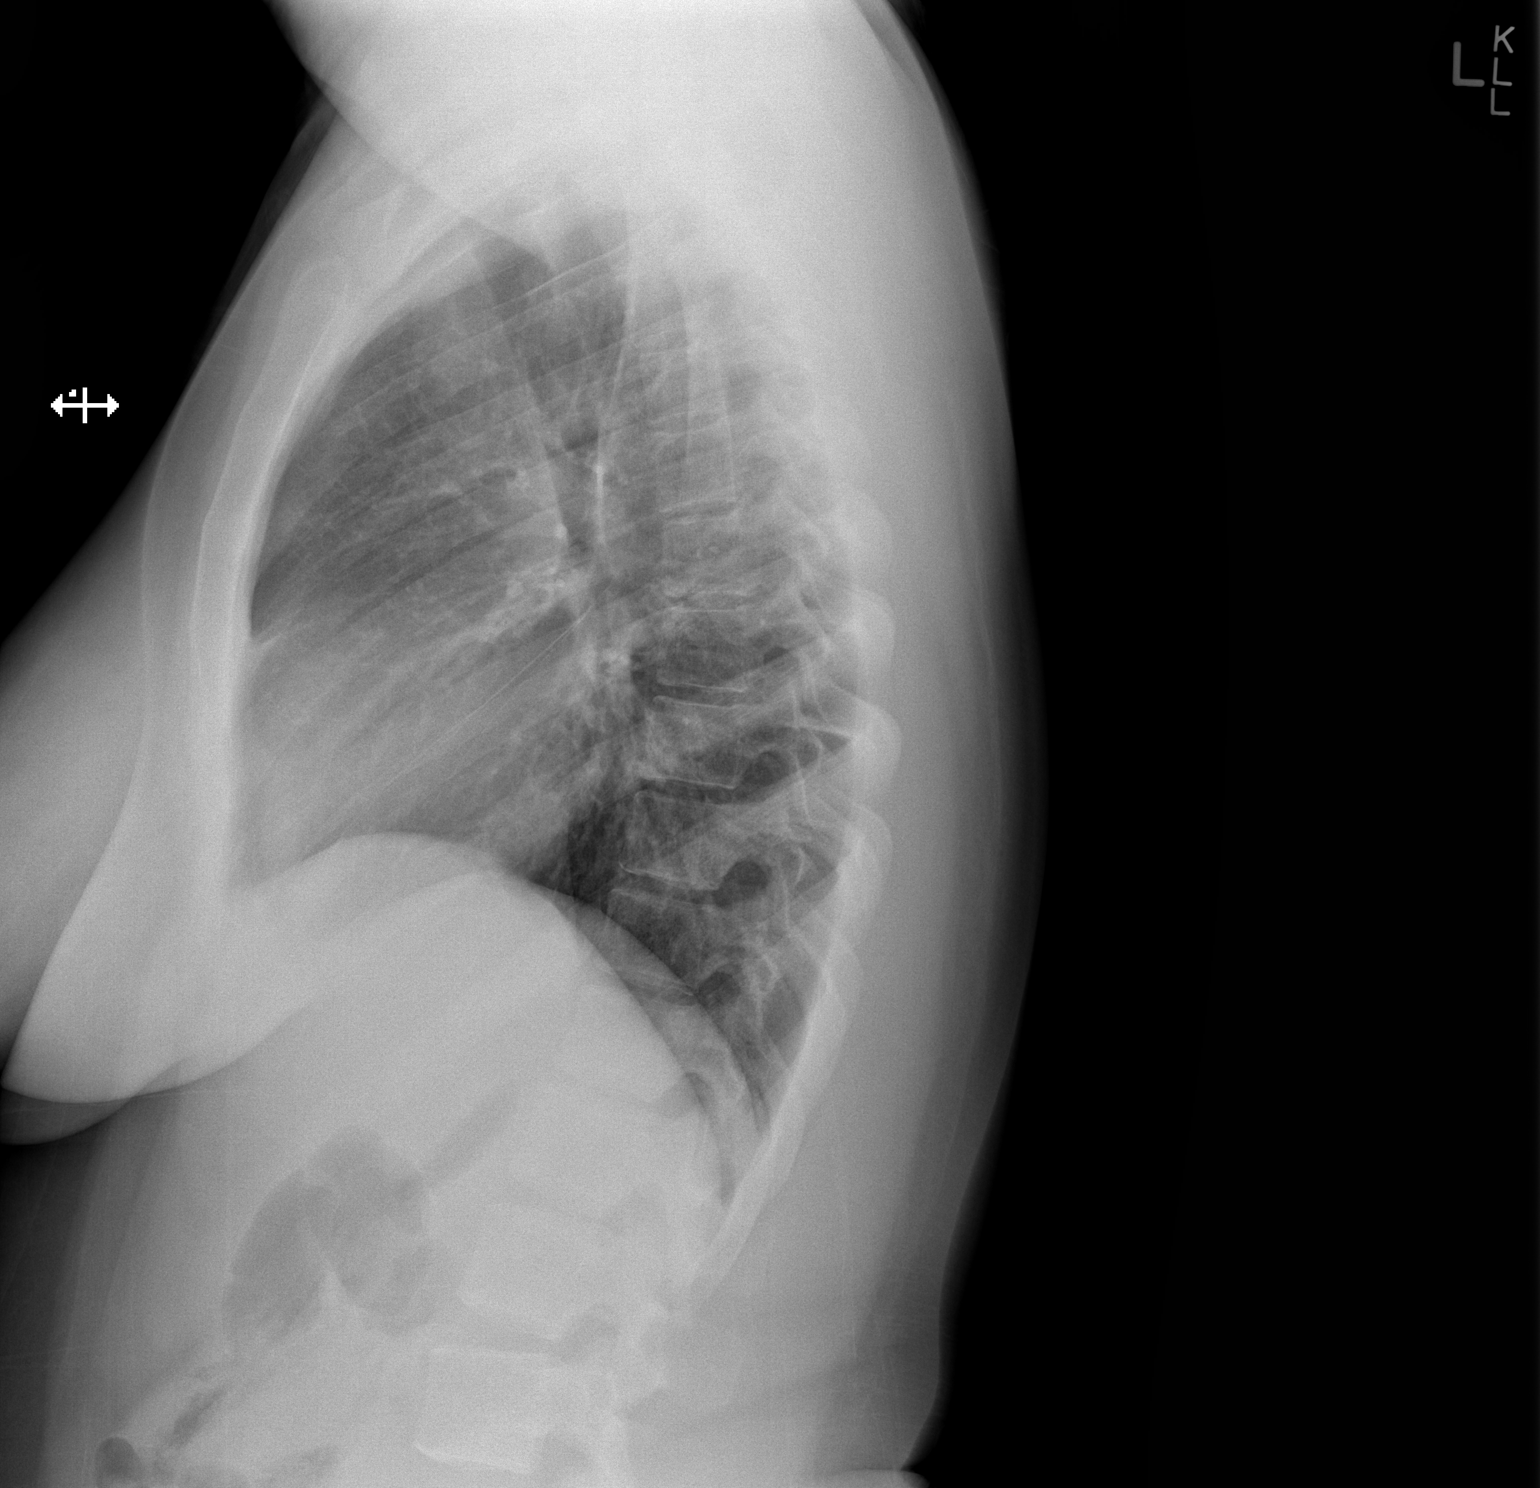

[2 of 2 positions shown; findings below may reference images not displayed]

FINDINGS: The heart size and mediastinal contours are within normal limits.
Both lungs are clear. The visualized skeletal structures are
unremarkable.
IMPRESSION: No active cardiopulmonary disease.

By: Awagavi Eliah M.D.
